# Patient Record
Sex: Female | Born: 1990 | Race: Black or African American | Hispanic: No | Marital: Single
Health system: Southern US, Community
[De-identification: ages and names within clinical notes are randomized; demographics above are authoritative.]

---

## 2011-09-11 LAB — CBC
HCT: 39.5 %
HGB: 13 g/dL
MCH: 29 pg
MCHC: 32.8 g/dL
MCV: 88 fL
Platelet: 258 10*3/uL
RBC: 4.48 X10 6/mm 3
RDW: 12.9 %
WBC: 7.7 10*3/uL

## 2011-09-11 LAB — COMPREHENSIVE METABOLIC PANEL WITH GFR
Albumin: 4 g/dL
Alkaline Phosphatase: 69 U/L
Anion Gap: 9
BUN: 17 mg/dL
Bilirubin,Total: 0.7 mg/dL
Calcium, Total: 8.9 mg/dL
Chloride: 101 mmol/L
Co2: 28 mmol/L
Creatinine: 1.14 mg/dL
EGFR (African American): >60
EGFR (Non-African Amer.): >60
Glucose: 100 mg/dL — ABNORMAL HIGH
Osmolality: 277
Potassium: 3.7 mmol/L
SGOT(AST): 25 U/L
SGPT (ALT): 22 U/L
Sodium: 138 mmol/L
Total Protein: 9.2 g/dL — ABNORMAL HIGH

## 2011-09-11 LAB — LIPASE, BLOOD: Lipase: 194 U/L (ref 73–393)

## 2011-09-11 LAB — PROTIME-INR
INR: 1
Prothrombin Time: 13.9 s

## 2011-09-11 LAB — PREGNANCY, URINE: Pregnancy Test, Urine: NEGATIVE m[IU]/mL

## 2011-09-11 LAB — URINALYSIS, COMPLETE
Blood: NEGATIVE
Nitrite: NEGATIVE
Ph: 5 (ref 4.5–8.0)
RBC,UR: 5 /HPF (ref 0–5)

## 2011-09-11 LAB — APTT: Activated PTT: 26.2 secs (ref 23.6–35.9)

## 2011-09-12 ENCOUNTER — Inpatient Hospital Stay: Payer: Self-pay | Admitting: Internal Medicine

## 2011-09-13 LAB — URINE CULTURE

## 2013-03-26 IMAGING — NM NM LUNG SCAN
2 series · 16 of 16 positions shown · non-contrast
Comparison: none

REASON FOR EXAM: DVT and palpitations / shoulder pain r/o PE
COMMENTS:   LMP: Negative Beta HCG

[Series 1000: lung ventilation · 3.90mm/px · 4 acquisitions, 8 frames shown]
[im 1/4]
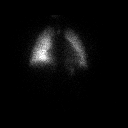
[im 1/4]
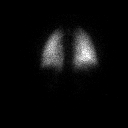
[im 2/4]
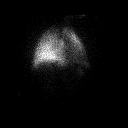
[im 2/4]
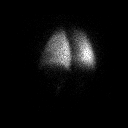
[im 3/4]
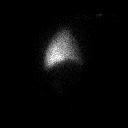
[im 3/4]
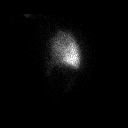
[im 4/4]
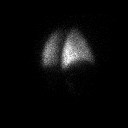
[im 4/4]
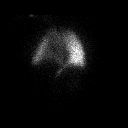

[Series 1000: lung perfusion · 1.95mm/px · 4 acquisitions, 8 frames shown]
[im 1/4]
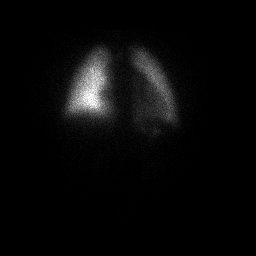
[im 1/4]
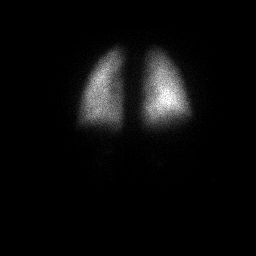
[im 2/4]
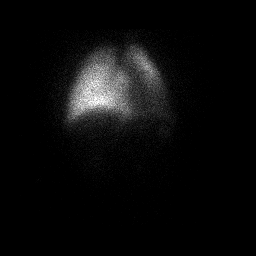
[im 2/4]
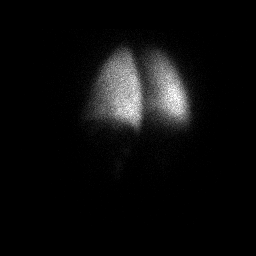
[im 3/4]
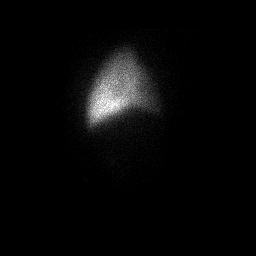
[im 3/4]
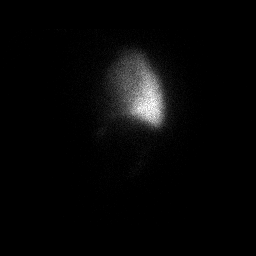
[im 4/4]
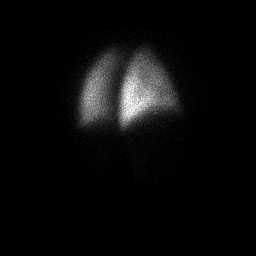
[im 4/4]
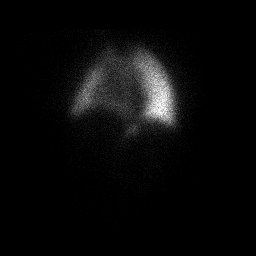

[16 of 16 positions shown; findings below may reference images not displayed]

PROCEDURE:     NM  - NM VQ LUNG SCAN  - [DATE] [DATE] [DATE] [DATE]

RESULT:

Procedure: Ventilation portion of the study was performed status post
aerosolized administration of 43.75 mCi of technetium 99m labeled DTPA. The
perfusion portion was performed status post right antecubital injection of
5.997 mCi of technetium 99m labeled MAA. This study was correlated with a
chest radiograph dated 09/12/2011. Scintigraphic imaging of the chest was
obtained in standard obliquities.
FINDINGS: There is no evidence of wedge-shaped, pleural-based ventilation
perfusion mismatched defects to suggest pulmonary arterial embolic disease.
The cardiac silhouette is enlarged. Chest radiograph demonstrates no
evidence of focal infiltrates, effusions or edema.
IMPRESSION: 1. Low probability of ventilation perfusion evaluation for pulmonary
arterial embolic disease.

## 2013-03-26 IMAGING — CR DG CHEST 1V
1 series · 1 of 1 positions shown · non-contrast
Comparison: none

REASON FOR EXAM: VQ scan
COMMENTS:

PROCEDURE:     DXR - DXR CHEST 1 VIEWAP OR PA  - September 12, 2011 [DATE]
RESULT:     Frontal view of the chest dated 09/12/2011 .

[x chest ap]
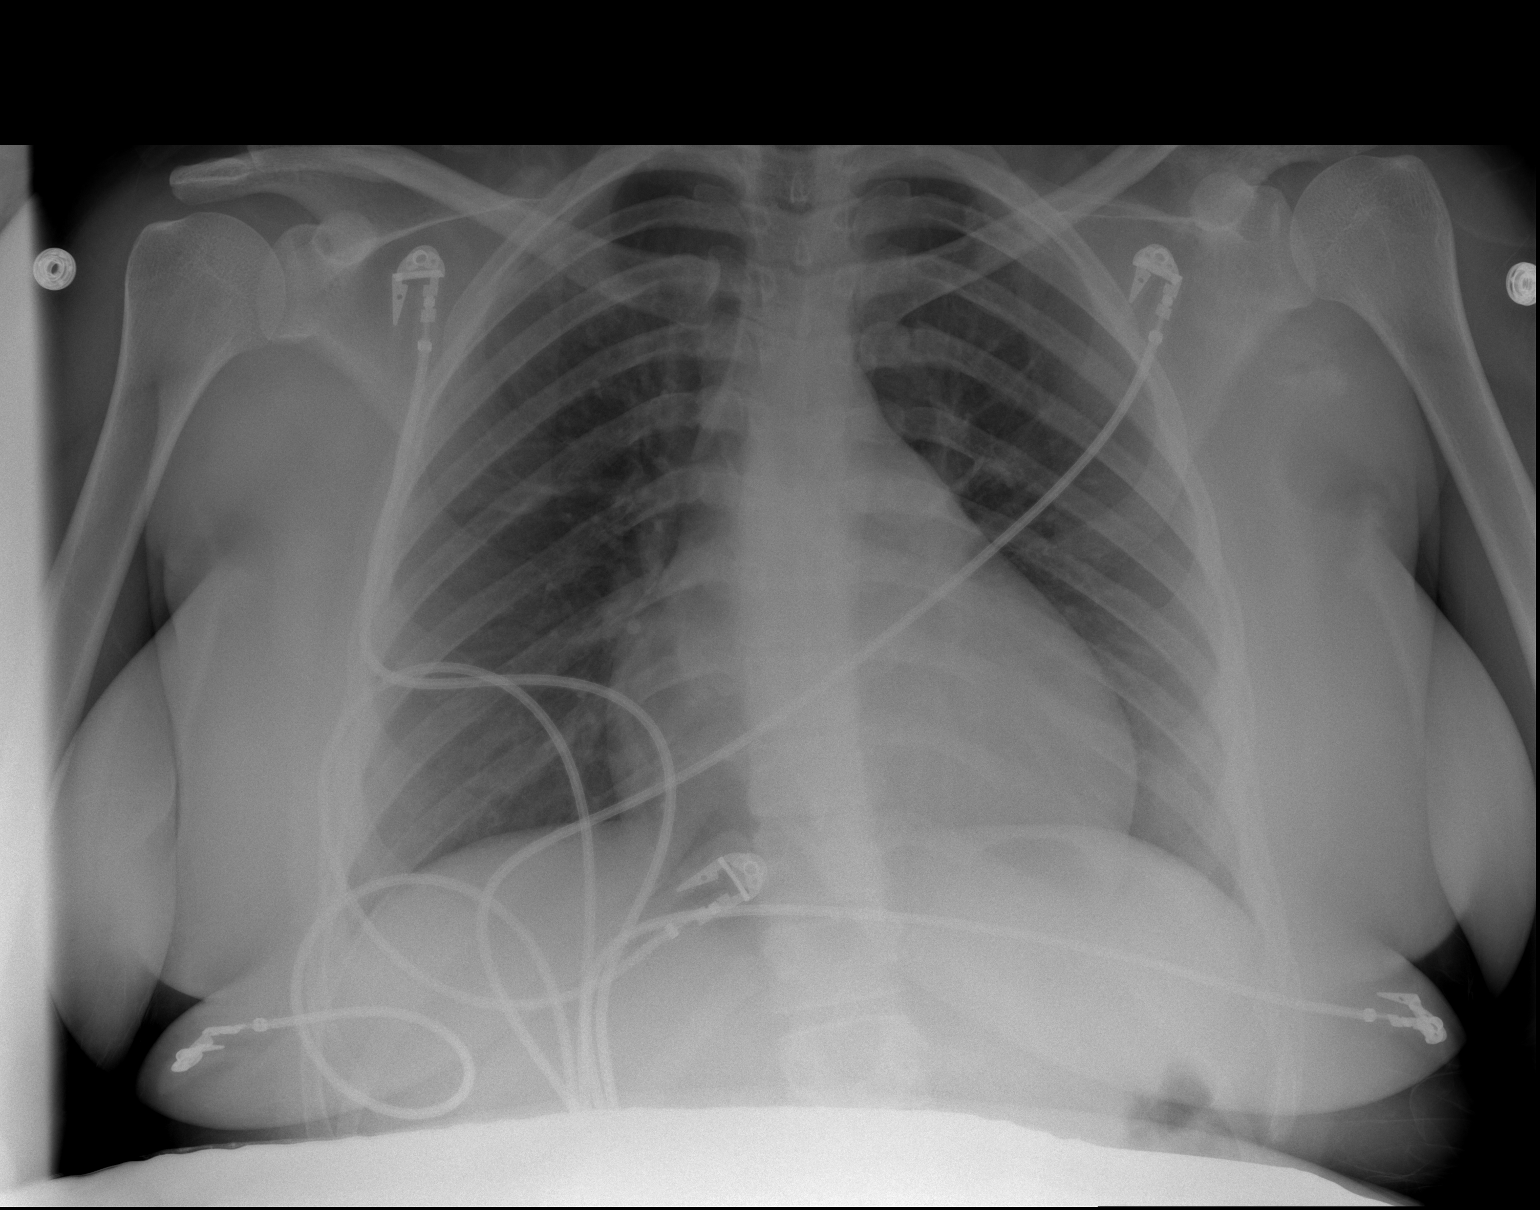

[1 of 1 positions shown; findings below may reference images not displayed]

FINDINGS: There is no evidence of focal infiltrates, effusions, edema,
masses, or nodules. The cardiac silhouette is moderately enlarged.
Visualized bony skeleton is unremarkable.
IMPRESSION: Chest radiograph without evidence of acute cardiopulmonary
disease.

## 2014-06-17 NOTE — Discharge Summary (Signed)
PATIENT NAME:  Julia Schmidt, Julia Schmidt MR#:  [id number] DATE OF BIRTH:  [birth year]  DATE OF ADMISSION:  09/12/2011 DATE OF DISCHARGE:  09/12/2011  PRIMARY CARE PHYSICIAN: Dr[facility] Mardella [facility]Lindsey Call [facility]   DISCHARGE DIAGNOSES:  1. Left lower extremity deep vein thrombosis.  2. Musculoskeletal right shoulder pain.  3. Dysmenorrhea.   CONSULTS: None.   LABORATORY, DIAGNOSTIC AND RADIOLOGICAL DATA:  Chest x-ray showed no acute cardiopulmonary disease.   Lower extremity venous Doppler showed diffuse deep vein thrombosis throughout the entire left lower extremity deep vein system.   V/Q scan was low probability for pulmonary embolism.   ADMITTING HISTORY AND PHYSICAL: Please see detailed history and physical dictated previously by Dr. Rudene Rearwish. In brief 24 year old morbidly obese African American female patient who was recently started on oral contraceptive pills presented to the Emergency Room with progressively worsening two week history of left leg pain and swelling. In the Emergency Room she was found to have extensive deep vein thrombosis and was admitted to the hospitalist service for further management after she complained of some right shoulder pain which raised concern for a pulmonary embolus. Patient did have allergy for shellfish and a V/Q scan was ordered.   HOSPITAL COURSE:  1. Left lower extremity deep vein thrombosis. Patient had significant swelling and pain in her left lower extremity for which she was started on Lovenox b.i.d. dosing. She was also managed with pain pills. On the day of discharge patient continues to have pain in her left lower extremity. She has been switched to Xarelto which will be covered by her insurance to continue for six months and follow up with her primary care physician. Patient did not have any tachycardia, has normal blood pressure, no chest pain and V/Q scan was low probability for PE.  2. Musculoskeletal chest pain. Patient did have some right shoulder pain was thought  to be musculoskeletal, however, the V/Q scan is negative.   CONDITION ON DISCHARGE: On the day of discharge, patient continues to have pain and swelling in her left lower extremity. Does not have chest pain, shortness of breath. Heart rate is 78, saturating 99% on room air. Blood pressure 106/74 and patient is being discharged home in a stable condition.   DISCHARGE MEDICATIONS:  1. Xarelto 15 mg oral twice a day for three weeks followed by 20 mg once a day for five months.  2. Ketorolac 10 mg oral twice a day as needed.  3. Ultracet with Tylenol 37.5/500 mg orally every six hours as needed for pain.   DISCHARGE INSTRUCTIONS: Patient will stop taking her oral contraceptive pills. She will have hi no restrictions on activity. Return to work when patient has decreased pain. Regular diet. Patient will follow up with her primary care physician in a week and continue medications as prescribed. Patient has been advised to call her doctor or return to the Emergency Room if she has any chest pain or shortness of breath. This plan was discussed with the patient and her family at bedside who verbalized understanding and are okay with the plan.   TIME SPENT: Time spent on this discharge dictation along with coordinating care, counseling of the patient was greater than 30 minutes.  ____________________________ Molinda BailiffSrikar R. Rebeka Kimble, MD srs:cms D: 09/12/2011 16:08:47 ET T: 09/13/2011 12:34:00 ET JOB#: [id number]  cc: Wardell HeathSrikar R. Darya Bigler, MD, <Dictator> Dr[facility]  Mckenna Boruff R Aydeen Blume MD ELECTRONICALLY SIGNED 09/16/2011 7:56

## 2014-06-22 NOTE — H&P (Signed)
PATIENT NAME:  Julia Schmidt, Julia Schmidt MR#:  [id number] DATE OF BIRTH:  1990/11/07  DATE OF ADMISSION:  09/12/2011  PRIMARY CARE PHYSICIAN:  Mardella Layman Call, MD at [facility]   CHIEF COMPLAINT: Left leg pain x2 weeks.   HISTORY OF PRESENT ILLNESS: Julia Schmidt is a 24 year old African American female who presented to the Emergency Department with two weeks' history of left leg pain which progressed to get worse, associated with swelling to the extent that it is affecting her walking; so she came to the Emergency Department for further evaluation. Doppler ultrasound revealed deep vein thrombosis involving the left common femoral and popliteal veins. The patient denies having any chest pain, however, she reports right shoulder pain for the last two days, but it appears to be musculoskeletal in component. It is worse with the movement whether walking or moving the right arm. She denies any chest pain but reports little palpitations an hour ago. The patient also noticed to be slightly tachycardic when she does activity. There is no cough, no hemoptysis. There is no prior history of pulmonary embolism or deep vein thrombosis. However, there is a family history of deep vein thrombosis. It is worth mentioning that a month ago the patient was started on a contraceptive pill to regulate her menstrual cycle and to treat her dysmenorrhea.    REVIEW OF SYSTEMS: CONSTITUTIONAL: Denies having fever, but here she was found to have low grade fever. No chills. No fatigue. EYES: No blurring of vision. No double vision. ENT: No hearing impairment. No sore throat. No dysphagia. CARDIOVASCULAR: No chest pain. No shortness of breath. She has edema in the left leg more swollen than the right leg. No syncope. RESPIRATORY: No shortness of breath. No chest pain. No hemoptysis. GASTROINTESTINAL: No abdominal pain. No vomiting, no diarrhea. GENITOURINARY: No dysuria. No frequency of urination. No hematuria. The patient does have underlying urinary tract  infection, being treated with Cipro. MUSCULOSKELETAL: No joint pain or swelling. No other muscular pain or swelling other than the left leg swelling and pain. INTEGUMENTARY: No skin rash. No ulcers. NEUROLOGY: No focal weakness. No seizure activity. No headache.  PSYCHIATRY: No anxiety. No depression. ENDOCRINE: No polyuria or polydipsia. No heat or cold intolerance.   PAST MEDICAL HISTORY: Healthy apart from dysmenorrhea treated a month ago with contraceptive pill. She was started on Sprintec daily to regulate her menstrual period. She had a visit to [facility] Emergency Department on July 6th with lower abdominal pain. She had CAT scan done, which showed some subcentimeter mesenteric lymph nodes consistent with mesenteric adenitis. The patient was treated conservatively, and her abdominal pain subsided within two days of that presentation. Otherwise her past medical history is healthy. There is no prior history of trauma to her leg.   SOCIAL HABITS: Nonsmoker. No history of alcohol abuse or drug abuse. She is single, lives with her father. She just moved recently. 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   FAMILY HISTORY: Her father has a problem with recurrent deep vein thrombosis. Her mother suffers from fibromyalgia.   ADMISSION MEDICATIONS: She was started a month ago on oral contraceptive pill.   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   PHYSICAL EXAMINATION:  VITAL SIGNS: Blood pressure 119/81, respiratory rate 18, pulse 99, temperature 99.2, oxygen saturation 99%.   GENERAL APPEARANCE: Young female lying in bed in no acute distress.   HEENT: Head: No pallor. No icterus. No cyanosis. ENT: Hearing was normal. Nasal mucosa, lips, tongue were normal. EYES: Normal iris and  conjunctivae. Pupils about 5 mm, equal and reactive to light.   NECK: Supple. Trachea at midline. No thyromegaly. No cervical lymphadenopathy. No masses.   HEART: Normal S1, S2. No S3, S4. No murmur. No gallop. No carotid  bruits.   LUNGS: Normal breathing pattern without use of accessory muscles. No rales. No wheezing.   ABDOMEN: Soft without tenderness. No hepatosplenomegaly. No masses. No hernias.   SKIN: No ulcers. No subcutaneous nodules.   MUSCULOSKELETAL: No joint swelling. No clubbing. The left leg is swollen from the thigh down to the calf area compared to the right leg. There is calf tenderness and tenderness along the posterior and medial side of the left thigh. Homans sign was negative.  NEUROLOGY: Cranial nerves II through XII are intact. No focal motor deficit.   PSYCHIATRY: The patient is alert and oriented x3. Mood and affect were normal.   LABORATORY, DIAGNOSTIC AND RADIOLOGICAL DATA:  Doppler ultrasound of the left lower extremity revealed largely occlusively thrombus within the left common femoral, superficial femoral, and popliteal veins.  EKG showed normal sinus rhythm at a rate of 88 per minute, inverted T waves in V1 to V3, and flattening of T waves from V4 to V6, otherwise unremarkable EKG.  Blood work-up showed glucose of 100, BUN 17, creatinine 1.1, sodium 138, potassium 3.7. Liver function tests were normal.  CBC showed white count of 7000, hemoglobin 13, hematocrit 39, platelet count 258. Prothrombin time 13.9, INR 1. APTT 26.  Urinalysis showed cloudy urine, +3 leukocyte esterase, 77 white blood cells, 5 RBCs. +1 bacteria.  Urine pregnancy test was negative.   ASSESSMENT:  1. Left leg deep vein thrombosis.  2. Feeling of palpitations just an hour ago associated with right shoulder pain, although the shoulder pain and appears to be musculoskeletal. However, with the nonspecific EKG changes we may need to rule out pulmonary embolism.  3. Nonspecific EKG abnormalities.  4. Dysmenorrhea.  5. Family history of deep vein thrombosis.   PLAN: The patient was started on Lovenox 80 mg twice a day. I ordered protein S and C along with antithrombin three and anticardiolipin antibodies.  Hopefully we will be able to add that to the previous blood sample prior to using Lovenox. I ordered a V/Q scan. They could not do a CTA of the chest since the radiologist was concerned about history of allergy to shellfish, and this may cause some allergic reaction to the intravenous dye.   TIME SPENT EVALUATING THIS PATIENT: More than 55 minutes.  ____________________________ Carney CornersAmir M. Rudene Rearwish, MD amd:cbb D: 09/12/2011 00:41:40 ET T: 09/12/2011 09:27:34 ET JOB#: [id number] cc: [facility], Autumn PattyLindsey Call, MD  Zollie ScaleAMIR M Merri Dimaano MD ELECTRONICALLY SIGNED 09/12/2011 22:22
